# Patient Record
Sex: Female | Born: 1985 | Race: White | Hispanic: No | Smoking: Current every day smoker
Health system: Southern US, Community
[De-identification: ages and names within clinical notes are randomized; demographics above are authoritative.]

## PROBLEM LIST (undated history)

## (undated) DIAGNOSIS — I208 Other forms of angina pectoris: Secondary | ICD-10-CM

## (undated) DIAGNOSIS — I2089 Other forms of angina pectoris: Secondary | ICD-10-CM

## (undated) HISTORY — PX: OTHER SURGICAL HISTORY: SHX169

---
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
## 2007-06-24 ENCOUNTER — Other Ambulatory Visit: Admission: RE | Admit: 2007-06-24 | Discharge: 2007-06-24 | Payer: Self-pay | Admitting: Obstetrics & Gynecology

## 2008-06-14 ENCOUNTER — Emergency Department (HOSPITAL_COMMUNITY): Admission: EM | Admit: 2008-06-14 | Discharge: 2008-06-14 | Payer: Self-pay | Admitting: Emergency Medicine

## 2008-11-25 ENCOUNTER — Emergency Department (HOSPITAL_COMMUNITY): Admission: EM | Admit: 2008-11-25 | Discharge: 2008-11-25 | Payer: Self-pay | Admitting: Emergency Medicine

## 2010-09-18 LAB — URINE MICROSCOPIC-ADD ON

## 2010-09-18 LAB — CBC
HCT: 45.3 % (ref 36.0–46.0)
Hemoglobin: 16 g/dL — ABNORMAL HIGH (ref 12.0–15.0)
MCHC: 35.3 g/dL (ref 30.0–36.0)
MCV: 96.1 fL (ref 78.0–100.0)
RBC: 4.71 MIL/uL (ref 3.87–5.11)
RDW: 13.7 % (ref 11.5–15.5)

## 2010-09-18 LAB — BASIC METABOLIC PANEL
CO2: 27 mEq/L (ref 19–32)
Chloride: 107 mEq/L (ref 96–112)
Glucose, Bld: 97 mg/dL (ref 70–99)
Potassium: 3.9 mEq/L (ref 3.5–5.1)
Sodium: 139 mEq/L (ref 135–145)

## 2010-09-18 LAB — DIFFERENTIAL
Basophils Absolute: 0 10*3/uL (ref 0.0–0.1)
Basophils Relative: 0 % (ref 0–1)
Eosinophils Relative: 3 % (ref 0–5)
Monocytes Absolute: 1.1 10*3/uL — ABNORMAL HIGH (ref 0.1–1.0)
Monocytes Relative: 5 % (ref 3–12)

## 2010-09-18 LAB — URINALYSIS, ROUTINE W REFLEX MICROSCOPIC
Bilirubin Urine: NEGATIVE
Glucose, UA: NEGATIVE mg/dL
Protein, ur: NEGATIVE mg/dL
Urobilinogen, UA: 0.2 mg/dL (ref 0.0–1.0)

## 2010-09-25 LAB — PREGNANCY, URINE: Preg Test, Ur: NEGATIVE
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
## 2010-10-27 NOTE — Discharge Summary (Signed)
[facility]  Patient:    Megan Barron, Megan Barron                           MRN: [id number] Adm. Date:  [id number] Disc. Date: [id number] Attending:  Veneta Penton                           Discharge Summary  REASON FOR ADMISSION:  This 25 year old white female was admitted complaining of depression with suicidal ideation with a plan to cut her throat.  For further history of present illness, please see the patients psychiatric admission assessment.  PHYSICAL EXAMINATION AT THE TIME OF ADMISSION:  Morbid obesity, psoriasis, scoliosis, and gastroesophageal reflux disease.  The patient also has a history of lactose intolerance and a sprained right fifth finger.  She had an otherwise unremarkable physical examination.  LABORATORY EXAMINATION:  The patient underwent a laboratory workup to rule out any medical problems contributing to her symptomatology.  Urine probe for gonorrhea and chlamydia were negative.  Hepatic panel showed SGOT 57, SGPT 63, and was otherwise unremarkable.  Valproic acid level was 103.3.  CBC was otherwise unremarkable.  Routine chemistry panel on admission showed potassium 2.8, BUN 5, and was otherwise unremarkable.  TSH was 6.453, free T4 was within normal limits.  Urine pregnancy test was within normal limits.  Urine drug screen was negative.  UA was unremarkable.  The patient received no x-rays, no special procedures, no additional consultations.  She sustained no complications during the course of this hospitalization.  HOSPITAL COURSE:  The patient rapidly adapted to unit routine, socializing well with both patients and staff.  She was oppositional and defiant, showed poor impulse control.  Affect and mood were depressed, irritable, and angry. She showed an excessive reliance on borderline and antisocial defense mechanisms.  She was titrated upward to a therapeutic dose of Depakote and has been continued on Topamax and Wellbutrin, which  were admission medications. She has tolerated these medications well without side effects.  She continues on Pepcid 20 mg per day for gastroesophageal reflux disease.  At the time of discharge she denies any homicidal or suicidal ideation.  Her affect and mood and improved.  She continues to show oppositional and defiant behavior.  She is manipulative, frequently lies, and continues to show and excessive reliance on antisocial and borderline defense mechanisms.  As she is participating in all aspects of the therapeutic treatment program and no longer appears to be a danger to herself or other, it is felt that she has reached her maximum benefits of hospitalization and is ready for discharge to a less restrictive alternative setting.  CONDITION ON DISCHARGE:  Improved.  DIAGNOSES: Axis I:    1. Major depression, recurrent type, severe without psychosis.            2. Rule out bipolar disorder, depressed type, severe without               psychosis.            3. Conduct disorder. Axis II:   1. Antisocial and borderline traits.            2. Rule out personality disorder, not otherwise specified. Axis III:  1. Gastroesophageal reflux disease.            2. Obesity.            3. Scoliosis.  4. Lactose intolerance.            5. Sprained right fifth finger.            6. Psoriasis. Axis IV:   Current psychosocial stressors are severe. Axis V:    20 on admission, 30 on discharge.  FURTHER EVALUATION AND TREATMENT RECOMMENDATIONS: 1. The patient is discharged to the custody of her DSS worker and will be    taken back to her group home. 2. She will follow up with Dr. Wynonia Lawman for all further aspects of her    psychiatric care and consequently, I will sign off on the case at this    time. 3. She is discharged on an unrestricted level of activity and a regular diet.  DISCHARGE MEDICATIONS: 1. Depakote ER 1500 mg p.o. q.h.s. 2. Wellbutrin SR 150 mg p.o. q.a.m. 3. Topamax 100  mg p.o. b.i.d. 4. Pepcid 20 mg p.o. q.d. DD:  12/03/00 TD:  12/03/00 Job: [id number]

## 2012-11-12 ENCOUNTER — Emergency Department (HOSPITAL_COMMUNITY)
Admission: EM | Admit: 2012-11-12 | Discharge: 2012-11-13 | Disposition: A | Discharge status: Home / Self Care | Payer: Medicaid Other | Attending: Emergency Medicine | Admitting: Emergency Medicine

## 2012-11-12 ENCOUNTER — Emergency Department (HOSPITAL_COMMUNITY): Payer: Medicaid Other

## 2012-11-12 ENCOUNTER — Encounter (HOSPITAL_COMMUNITY): Payer: Self-pay | Admitting: Emergency Medicine

## 2012-11-12 DIAGNOSIS — F17200 Nicotine dependence, unspecified, uncomplicated: Secondary | ICD-10-CM | POA: Insufficient documentation

## 2012-11-12 DIAGNOSIS — N739 Female pelvic inflammatory disease, unspecified: Secondary | ICD-10-CM | POA: Insufficient documentation

## 2012-11-12 DIAGNOSIS — R109 Unspecified abdominal pain: Secondary | ICD-10-CM | POA: Insufficient documentation

## 2012-11-12 DIAGNOSIS — N898 Other specified noninflammatory disorders of vagina: Secondary | ICD-10-CM | POA: Insufficient documentation

## 2012-11-12 DIAGNOSIS — Z87410 Personal history of cervical dysplasia: Secondary | ICD-10-CM | POA: Insufficient documentation

## 2012-11-12 DIAGNOSIS — Z3202 Encounter for pregnancy test, result negative: Secondary | ICD-10-CM | POA: Insufficient documentation

## 2012-11-12 DIAGNOSIS — R300 Dysuria: Secondary | ICD-10-CM | POA: Insufficient documentation

## 2012-11-12 LAB — CBC WITH DIFFERENTIAL/PLATELET
Basophils Absolute: 0 10*3/uL (ref 0.0–0.1)
Basophils Relative: 0 % (ref 0–1)
Eosinophils Absolute: 0.3 10*3/uL (ref 0.0–0.7)
Lymphocytes Relative: 17 % (ref 12–46)
MCH: 32.7 pg (ref 26.0–34.0)
MCHC: 34.6 g/dL (ref 30.0–36.0)
Monocytes Absolute: 1.3 10*3/uL — ABNORMAL HIGH (ref 0.1–1.0)
Neutrophils Relative %: 77 % (ref 43–77)
Platelets: 341 10*3/uL (ref 150–400)
RDW: 13 % (ref 11.5–15.5)

## 2012-11-12 LAB — COMPREHENSIVE METABOLIC PANEL
AST: 20 U/L (ref 0–37)
CO2: 26 mEq/L (ref 19–32)
Calcium: 10 mg/dL (ref 8.4–10.5)
Creatinine, Ser: 0.67 mg/dL (ref 0.50–1.10)
GFR calc non Af Amer: >90 mL/min (ref >90)
Sodium: 135 mEq/L (ref 135–145)
Total Protein: 8 g/dL (ref 6.0–8.3)

## 2012-11-12 LAB — WET PREP, GENITAL
Trich, Wet Prep: NONE SEEN
Yeast Wet Prep HPF POC: NONE SEEN

## 2012-11-12 LAB — URINALYSIS, ROUTINE W REFLEX MICROSCOPIC
Bilirubin Urine: NEGATIVE
Ketones, ur: NEGATIVE mg/dL
Nitrite: NEGATIVE
Specific Gravity, Urine: 1.02 (ref 1.005–1.030)
Urobilinogen, UA: 0.2 mg/dL (ref 0.0–1.0)
pH: 6 (ref 5.0–8.0)

## 2012-11-12 LAB — URINE MICROSCOPIC-ADD ON

## 2012-11-12 MED ORDER — CEFTRIAXONE SODIUM 250 MG IJ SOLR
250.0000 mg | Freq: Once | INTRAMUSCULAR | Status: AC
  Administered 2012-11-12: 250 mg via INTRAMUSCULAR
  Filled 2012-11-12: qty 250

## 2012-11-12 MED ORDER — SODIUM CHLORIDE 0.9 % IV BOLUS (SEPSIS)
1000.0000 mL | Freq: Once | INTRAVENOUS | Status: AC
  Administered 2012-11-12: 1000 mL via INTRAVENOUS

## 2012-11-12 MED ORDER — DOXYCYCLINE HYCLATE 100 MG PO TABS
100.0000 mg | ORAL_TABLET | Freq: Once | ORAL | Status: AC
  Administered 2012-11-12: 100 mg via ORAL
  Filled 2012-11-12: qty 1

## 2012-11-12 MED ORDER — OXYCODONE-ACETAMINOPHEN 5-325 MG PO TABS: 2.0000 | ORAL_TABLET | ORAL | Status: DC | PRN

## 2012-11-12 MED ORDER — PROMETHAZINE HCL 25 MG PO TABS: 25.0000 mg | ORAL_TABLET | Freq: Four times a day (QID) | ORAL | Status: DC | PRN

## 2012-11-12 MED ORDER — LIDOCAINE HCL (PF) 1 % IJ SOLN
INTRAMUSCULAR | Status: AC
  Administered 2012-11-12: 0.9 mL via INTRAMUSCULAR
  Filled 2012-11-12: qty 5

## 2012-11-12 MED ORDER — METRONIDAZOLE 500 MG PO TABS: 500.0000 mg | ORAL_TABLET | Freq: Two times a day (BID) | ORAL | Status: DC

## 2012-11-12 MED ORDER — IOHEXOL 300 MG/ML  SOLN
50.0000 mL | Freq: Once | INTRAMUSCULAR | Status: AC | PRN
  Administered 2012-11-12: 50 mL via ORAL

## 2012-11-12 MED ORDER — ONDANSETRON HCL 4 MG/2ML IJ SOLN
4.0000 mg | Freq: Once | INTRAMUSCULAR | Status: AC
  Administered 2012-11-12: 4 mg via INTRAVENOUS
  Filled 2012-11-12: qty 2

## 2012-11-12 MED ORDER — HYDROMORPHONE HCL PF 1 MG/ML IJ SOLN
1.0000 mg | Freq: Once | INTRAMUSCULAR | Status: AC
  Administered 2012-11-12: 1 mg via INTRAVENOUS
  Filled 2012-11-12: qty 1

## 2012-11-12 MED ORDER — IOHEXOL 300 MG/ML  SOLN
100.0000 mL | Freq: Once | INTRAMUSCULAR | Status: AC | PRN
  Administered 2012-11-12: 100 mL via INTRAVENOUS

## 2012-11-12 MED ORDER — METRONIDAZOLE 500 MG PO TABS
500.0000 mg | ORAL_TABLET | Freq: Once | ORAL | Status: AC
  Administered 2012-11-12: 500 mg via ORAL
  Filled 2012-11-12: qty 1

## 2012-11-12 MED ORDER — DOXYCYCLINE HYCLATE 100 MG PO CAPS: 100.0000 mg | ORAL_CAPSULE | Freq: Two times a day (BID) | ORAL | Status: DC

## 2012-11-12 NOTE — ED Provider Notes (Addendum)
History     This chart was scribed for Donnetta Hutching, MD, MD by Smitty Pluck, ED Scribe. The patient was seen in room APA15/APA15 and the patient's care was started at 7:53 PM.   CSN: [id number]  Arrival date & time 11/12/12  1859    Chief Complaint  Patient presents with  . Pelvic Pain  . Dysuria  . pelvic pressure      The history is provided by the patient and medical records. No language interpreter was used.   HPI Comments: Megan Barron is a 27 y.o. 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 Pain is rated at 8/10 currently and at worst was 10/10. She mentions having mild vaginal discharge. She states her LMP was in March 2014. She reports hx of cervical dysplasia.  Pt denies vaginal bleeding, fever, chills, nausea, vomiting, diarrhea, weakness, cough, SOB and any other pain.   History reviewed. No pertinent past medical history.  Past Surgical History  Procedure Laterality Date  . Freon procedure for cervical dysplasia      Family History  Problem Relation Age of Onset  . Diabetes Father   . Cancer Other   . Diabetes Other     History  Substance Use Topics  . Smoking status: Current Every Day Smoker -- 1.00 packs/day    Types: Cigarettes  . Smokeless tobacco: Not on file  . Alcohol Use: Yes    OB History   Grav Para Term Preterm Abortions TAB SAB Ect Mult Living                  Review of Systems 10 Systems reviewed and all are negative for acute change except as noted in the HPI.   Allergies  Review of patient's allergies indicates no known allergies.  Home Medications  No current outpatient prescriptions on file.  BP 144/82  Pulse 119  Temp(Src) 99 F (37.2 C) (Oral)  Resp 20  Ht 5\' 2"  (1.575 m)  Wt 210 lb (95.255 kg)  BMI 38.4 kg/m2  SpO2 99%  LMP 08/12/2012  Physical Exam  Nursing note and vitals  reviewed. Constitutional: She is oriented to person, place, and time. She appears well-developed and well-nourished.  HENT:  Head: Normocephalic and atraumatic.  Eyes: Conjunctivae and EOM are normal. Pupils are equal, round, and reactive to light.  Neck: Normal range of motion. Neck supple.  Cardiovascular: Normal rate, regular rhythm and normal heart sounds.   Pulmonary/Chest: Effort normal and breath sounds normal.  Abdominal: Soft. Bowel sounds are normal.  Genitourinary:  External genitalia nl Yellow discharge from cervical os. cervical motion tenderness No adnexal tenderness   Musculoskeletal: Normal range of motion.  Neurological: She is alert and oriented to person, place, and time.  Skin: Skin is warm and dry.  Psychiatric: She has a normal mood and affect.    ED Course  Procedures (including critical care time) DIAGNOSTIC STUDIES: Oxygen Saturation is 99% on room air, normal by my interpretation.    COORDINATION OF CARE: 7:56 PM Discussed ED treatment with pt and pt agrees labs, pelvic and pain medications     Results for orders placed during the hospital encounter of 11/12/12  WET PREP, GENITAL      Result Value Range   Yeast Wet Prep HPF POC NONE SEEN  NONE SEEN   Trich, Wet Prep NONE SEEN  NONE SEEN   Clue Cells Wet Prep HPF POC FEW (*) NONE SEEN   WBC, Wet Prep HPF POC MANY (*) NONE SEEN  URINALYSIS, ROUTINE W REFLEX MICROSCOPIC      Result Value Range   Color, Urine YELLOW  YELLOW   APPearance CLEAR  CLEAR   Specific Gravity, Urine 1.020  1.005 - 1.030   pH 6.0  5.0 - 8.0   Glucose, UA NEGATIVE  NEGATIVE mg/dL   Hgb urine dipstick SMALL (*) NEGATIVE   Bilirubin Urine NEGATIVE  NEGATIVE   Ketones, ur NEGATIVE  NEGATIVE mg/dL   Protein, ur NEGATIVE  NEGATIVE mg/dL   Urobilinogen, UA 0.2  0.0 - 1.0 mg/dL   Nitrite NEGATIVE  NEGATIVE   Leukocytes, UA MODERATE (*) NEGATIVE  PREGNANCY, URINE      Result Value Range   Preg Test, Ur NEGATIVE  NEGATIVE  URINE  MICROSCOPIC-ADD ON      Result Value Range   Squamous Epithelial / LPF MANY (*) RARE   WBC, UA 3-6  <3 WBC/hpf   RBC / HPF 0-2  <3 RBC/hpf   Bacteria, UA FEW (*) RARE  CBC WITH DIFFERENTIAL      Result Value Range   WBC 25.2 (*) 4.0 - 10.5 K/uL   RBC 4.80  3.87 - 5.11 MIL/uL   Hemoglobin 15.7 (*) 12.0 - 15.0 g/dL   HCT 16.1  09.6 - 04.5 %   MCV 94.6  78.0 - 100.0 fL   MCH 32.7  26.0 - 34.0 pg   MCHC 34.6  30.0 - 36.0 g/dL   RDW 40.9  81.1 - 91.4 %   Platelets 341  150 - 400 K/uL   Neutrophils Relative % 77  43 - 77 %   Lymphocytes Relative 17  12 - 46 %   Monocytes Relative 5  3 - 12 %   Eosinophils Relative 1  0 - 5 %   Basophils Relative 0  0 - 1 %   Neutro Abs 19.3 (*) 1.7 - 7.7 K/uL   Lymphs Abs 4.3 (*) 0.7 - 4.0 K/uL   Monocytes Absolute 1.3 (*) 0.1 - 1.0 K/uL   Eosinophils Absolute 0.3  0.0 - 0.7 K/uL   Basophils Absolute 0.0  0.0 - 0.1 K/uL   RBC Morphology STOMATOCYTES     WBC Morphology ATYPICAL LYMPHOCYTES    COMPREHENSIVE METABOLIC PANEL      Result Value Range   Sodium 135  135 - 145 mEq/L   Potassium 3.5  3.5 - 5.1 mEq/L   Chloride 98  96 - 112 mEq/L   CO2 26  19 - 32 mEq/L   Glucose, Bld 90  70 - 99 mg/dL   BUN 8  6 - 23 mg/dL   Creatinine, Ser 7.82  0.50 - 1.10 mg/dL   Calcium 95.6  8.4 - 21.3 mg/dL   Total Protein 8.0  6.0 - 8.3 g/dL   Albumin 4.5  3.5 - 5.2 g/dL   AST 20  0 - 37 U/L   ALT 20  0 - 35 U/L   Alkaline Phosphatase 81  39 - 117 U/L   Total Bilirubin 0.5  0.3 - 1.2 mg/dL   GFR calc non Af Amer >90  >90 mL/min   GFR calc Af Amer >90  >90 mL/min      Ct Abdomen Pelvis W Contrast  11/12/2012   *RADIOLOGY REPORT*  Clinical Data: Lower abdominal pain and pelvic pain for 3 months.  CT  ABDOMEN AND PELVIS WITH CONTRAST  Technique:  Multidetector CT imaging of the abdomen and pelvis was performed following the standard protocol during bolus administration of intravenous contrast.  Contrast: 100 mL of Omnipaque 300 IV contrast  Comparison: CT of the  abdomen and pelvis from 11/25/2008  Findings: The visualized lung bases are clear.  The liver and spleen are unremarkable in appearance.  The gallbladder is mildly distended but otherwise unremarkable in appearance.  The pancreas and adrenal glands are unremarkable.  The kidneys are unremarkable in appearance.  There is no evidence of hydronephrosis.  No renal or ureteral stones are seen.  No perinephric stranding is appreciated.  The small bowel is unremarkable in appearance.  The stomach is within normal limits.  No acute vascular abnormalities are seen.  The appendix is not well characterized; there is no definite evidence for appendicitis.  The colon is unremarkable in appearance.  The bladder is mildly distended and grossly unremarkable.  There is a recurrent 4.3 x 1.8 cm collection of fluid noted within the lower uterine segment; on correlation with the patient's history, this could be related to recurrent pelvic inflammatory disease.  There is associated nonspecific soft tissue stranding and the upper pelvis, and trace free fluid in the pelvis.  No inguinal lymphadenopathy is seen.  The right ovary is larger than the left; this appears grossly stable from 2010 and likely reflects the patient's baseline.  No suspicious adnexal masses are seen.  No acute osseous abnormalities are identified.  IMPRESSION: Recurrent 4.3 x 1.8 cm collection of fluid noted within the lower uterine segment, though it is slightly smaller than in 2010.  Multiple possible etiologies were suggested on the prior study, ranging from blood within the endometrial canal to intrauterine pregnancy, but given the patient's known history of pelvic inflammatory disease, would correlate for evidence of recurrent pelvic inflammatory disease.  Associated mild soft tissue inflammation noted within the pelvis.   Original Report Authenticated By: Tonia Ghent, M.D.     No diagnosis found.    MDM  History and physical consistent with pelvic  inflammatory disease. No acute abdomen. Rocephin 250 mg IV given along with first dose of doxycycline 100 mg and Flagyl 500 mg.    Will take Flagyl and doxycycline for 2 weeks. Also medication for pain and nausea.  Referral to local gynecologist.  Patient is not septic and hemodynamically stable.     I personally performed the services described in this documentation, which was scribed in my presence. The recorded information has been reviewed and is accurate.    Donnetta Hutching, MD 11/12/12 1610  Donnetta Hutching, MD 11/15/12 0930

## 2012-11-12 NOTE — ED Notes (Signed)
Pt c/o pelvic pain with pain while urinating and having a BM. Pt has had PID several years ago.

## 2012-11-13 LAB — URINE CULTURE
Colony Count: NO GROWTH
Culture: NO GROWTH

## 2012-11-14 LAB — GC/CHLAMYDIA PROBE AMP: GC Probe RNA: NEGATIVE

## 2012-12-15 ENCOUNTER — Ambulatory Visit: Payer: Self-pay | Admitting: Obstetrics and Gynecology

## 2013-09-02 ENCOUNTER — Emergency Department (HOSPITAL_COMMUNITY)
Admission: EM | Admit: 2013-09-02 | Discharge: 2013-09-02 | Disposition: A | Discharge status: Home / Self Care | Payer: Medicaid Other | Attending: Emergency Medicine | Admitting: Emergency Medicine

## 2013-09-02 ENCOUNTER — Encounter (HOSPITAL_COMMUNITY): Payer: Self-pay | Admitting: Emergency Medicine

## 2013-09-02 DIAGNOSIS — F17200 Nicotine dependence, unspecified, uncomplicated: Secondary | ICD-10-CM | POA: Insufficient documentation

## 2013-09-02 DIAGNOSIS — Z3202 Encounter for pregnancy test, result negative: Secondary | ICD-10-CM | POA: Insufficient documentation

## 2013-09-02 DIAGNOSIS — K5289 Other specified noninfective gastroenteritis and colitis: Secondary | ICD-10-CM | POA: Insufficient documentation

## 2013-09-02 DIAGNOSIS — K529 Noninfective gastroenteritis and colitis, unspecified: Secondary | ICD-10-CM

## 2013-09-02 DIAGNOSIS — I209 Angina pectoris, unspecified: Secondary | ICD-10-CM | POA: Insufficient documentation

## 2013-09-02 HISTORY — DX: Other forms of angina pectoris: I20.8

## 2013-09-02 HISTORY — DX: Other forms of angina pectoris: I20.89

## 2013-09-02 LAB — URINALYSIS, ROUTINE W REFLEX MICROSCOPIC
Bilirubin Urine: NEGATIVE
GLUCOSE, UA: NEGATIVE mg/dL
LEUKOCYTES UA: NEGATIVE
Nitrite: NEGATIVE
PH: 6 (ref 5.0–8.0)
Specific Gravity, Urine: >1.03 — ABNORMAL HIGH (ref 1.005–1.030)
Urobilinogen, UA: 0.2 mg/dL (ref 0.0–1.0)

## 2013-09-02 LAB — CBC WITH DIFFERENTIAL/PLATELET
BASOS ABS: 0 10*3/uL (ref 0.0–0.1)
Basophils Relative: 0 % (ref 0–1)
Eosinophils Absolute: 0.4 10*3/uL (ref 0.0–0.7)
Eosinophils Relative: 4 % (ref 0–5)
HEMATOCRIT: 47.6 % — AB (ref 36.0–46.0)
HEMOGLOBIN: 16.1 g/dL — AB (ref 12.0–15.0)
LYMPHS PCT: 30 % (ref 12–46)
Lymphs Abs: 2.9 10*3/uL (ref 0.7–4.0)
MCH: 32.7 pg (ref 26.0–34.0)
MCHC: 33.8 g/dL (ref 30.0–36.0)
MCV: 96.6 fL (ref 78.0–100.0)
MONO ABS: 0.5 10*3/uL (ref 0.1–1.0)
MONOS PCT: 5 % (ref 3–12)
NEUTROS ABS: 6 10*3/uL (ref 1.7–7.7)
Neutrophils Relative %: 62 % (ref 43–77)
Platelets: 351 10*3/uL (ref 150–400)
RBC: 4.93 MIL/uL (ref 3.87–5.11)
RDW: 12.8 % (ref 11.5–15.5)
WBC: 9.7 10*3/uL (ref 4.0–10.5)

## 2013-09-02 LAB — HEPATIC FUNCTION PANEL
ALBUMIN: 4.2 g/dL (ref 3.5–5.2)
ALT: 106 U/L — ABNORMAL HIGH (ref 0–35)
AST: 65 U/L — AB (ref 0–37)
Alkaline Phosphatase: 77 U/L (ref 39–117)
Bilirubin, Direct: <0.2 mg/dL (ref 0.0–0.3)
TOTAL PROTEIN: 8.2 g/dL (ref 6.0–8.3)
Total Bilirubin: 0.5 mg/dL (ref 0.3–1.2)

## 2013-09-02 LAB — BASIC METABOLIC PANEL
BUN: 9 mg/dL (ref 6–23)
CHLORIDE: 102 meq/L (ref 96–112)
CO2: 26 meq/L (ref 19–32)
CREATININE: 0.64 mg/dL (ref 0.50–1.10)
Calcium: 8.7 mg/dL (ref 8.4–10.5)
GFR calc Af Amer: >90 mL/min (ref >90)
GFR calc non Af Amer: >90 mL/min (ref >90)
Glucose, Bld: 139 mg/dL — ABNORMAL HIGH (ref 70–99)
Potassium: 3.9 mEq/L (ref 3.7–5.3)
Sodium: 141 mEq/L (ref 137–147)

## 2013-09-02 LAB — URINE MICROSCOPIC-ADD ON

## 2013-09-02 LAB — PREGNANCY, URINE: PREG TEST UR: NEGATIVE

## 2013-09-02 MED ORDER — ONDANSETRON 4 MG PO TBDP: ORAL_TABLET | ORAL | Status: DC

## 2013-09-02 MED ORDER — DICYCLOMINE HCL 20 MG PO TABS: ORAL_TABLET | ORAL | Status: DC

## 2013-09-02 MED ORDER — KETOROLAC TROMETHAMINE 30 MG/ML IJ SOLN
30.0000 mg | Freq: Once | INTRAMUSCULAR | Status: AC
  Administered 2013-09-02: 30 mg via INTRAVENOUS
  Filled 2013-09-02: qty 1

## 2013-09-02 MED ORDER — SODIUM CHLORIDE 0.9 % IV BOLUS (SEPSIS)
1000.0000 mL | Freq: Once | INTRAVENOUS | Status: AC
  Administered 2013-09-02: 1000 mL via INTRAVENOUS

## 2013-09-02 MED ORDER — ONDANSETRON HCL 4 MG/2ML IJ SOLN
4.0000 mg | Freq: Once | INTRAMUSCULAR | Status: AC
  Administered 2013-09-02: 4 mg via INTRAVENOUS
  Filled 2013-09-02: qty 2

## 2013-09-02 NOTE — ED Provider Notes (Signed)
CSN: [id number]     Arrival date & time 09/02/13  0756 History   First MD Initiated Contact with Patient 09/02/13 0807    This chart was scribed for Benny Lennert, MD by Valera Castle, ED Scribe. This patient was seen in room APA09/APA09 and the patient's care was started at 8:26 AM.  Chief Complaint  Patient presents with  . Emesis   (Consider location/radiation/quality/duration/timing/severity/associated sxs/prior Treatment)  Patient is a 28 y.o. female presenting with vomiting and diarrhea. The history is provided by the patient. No language interpreter was used.  Emesis Duration:  1 week Progression:  Worsening Chronicity:  New Recent urination:  Normal Associated symptoms: diarrhea   Associated symptoms: no abdominal pain and no headaches   Diarrhea Diarrhea characteristics: dark, watery. Duration:  1 week Timing: every 15-20 minutes. Progression:  Worsening Associated symptoms: vomiting   Associated symptoms: no abdominal pain and no headaches    HPI Comments: Megan Barron is a 28 y.o. female who presents to the Emergency Department complaining of intermittent vomiting, with associated nausea and diarrhea, onset 1 week ago. She reports having diarrhea every 15-20 minutes. She reports dark stools with bowel movements. She reports her nephews are having similar symptoms of diarrhea and vomiting. She states she takes 1 Iron pill a month, states it hurts her stomach. She denies any other associated symptoms. She denies h/o abdominal surgery.  PCP - No primary provider on file.  Past Medical History  Diagnosis Date  . Angina at rest    Past Surgical History  Procedure Laterality Date  . Freon procedure for cervical dysplasia     Family History  Problem Relation Age of Onset  . Diabetes Father   . Cancer Other   . Diabetes Other    History  Substance Use Topics  . Smoking status: Current Every Day Smoker -- 1.00 packs/day    Types: Cigarettes  . Smokeless tobacco:  Not on file  . Alcohol Use: Yes   OB History   Grav Para Term Preterm Abortions TAB SAB Ect Mult Living                 Review of Systems  Constitutional: Negative for appetite change and fatigue.  HENT: Negative for congestion, ear discharge and sinus pressure.   Eyes: Negative for discharge.  Respiratory: Negative for cough.   Cardiovascular: Negative for chest pain.  Gastrointestinal: Positive for nausea, vomiting and diarrhea. Negative for abdominal pain.  Genitourinary: Negative for frequency and hematuria.  Musculoskeletal: Negative for back pain.  Skin: Negative for rash.  Neurological: Negative for seizures and headaches.  Psychiatric/Behavioral: Negative for hallucinations.   Allergies  Review of patient's allergies indicates no known allergies.  Home Medications   Current Outpatient Rx  Name  Route  Sig  Dispense  Refill  . calcium carbonate (TUMS - DOSED IN MG ELEMENTAL CALCIUM) 500 MG chewable tablet   Oral   Chew 2 tablets by mouth 3 (three) times daily as needed for indigestion or heartburn.         . ferrous sulfate 325 (65 FE) MG tablet   Oral   Take 325 mg by mouth every 30 (thirty) days.          . Multiple Vitamin (MULTIVITAMIN WITH MINERALS) TABS tablet   Oral   Take 1 tablet by mouth daily.         Marland Kitchen Pheniramine-PE-APAP (THERAFLU COLD & SORE THROAT PO)   Oral   Take 1 Package  by mouth 2 (two) times daily as needed (COLD/COUGH).         . Pseudoephedrine-APAP-DM (TYLENOL COLD/FLU SEVERE DAY PO)   Oral   Take 2 capsules by mouth every 6 (six) hours as needed (COLD/COUGH).          BP 121/80  Pulse 100  Temp(Src) 97.8 F (36.6 C) (Oral)  Resp 20  Ht 5\' 3"  (1.6 m)  Wt 240 lb (108.863 kg)  BMI 42.52 kg/m2  SpO2 96%  Physical Exam  Constitutional: She is oriented to person, place, and time. She appears well-developed.  HENT:  Head: Normocephalic.  Eyes: Conjunctivae and EOM are normal. No scleral icterus.  Neck: Neck supple. No  thyromegaly present.  Cardiovascular: Normal rate and regular rhythm.  Exam reveals no gallop and no friction rub.   No murmur heard. Pulmonary/Chest: No stridor. She has no wheezes. She has no rales. She exhibits no tenderness.  Abdominal: She exhibits no distension. There is no tenderness. There is no rebound.  Musculoskeletal: Normal range of motion. She exhibits no edema.  Lymphadenopathy:    She has no cervical adenopathy.  Neurological: She is oriented to person, place, and time. She exhibits normal muscle tone. Coordination normal.  Skin: No rash noted. No erythema.  Psychiatric: She has a normal mood and affect. Her behavior is normal.    ED Course  Procedures (including critical care time)  DIAGNOSTIC STUDIES: Oxygen Saturation is 96% on room air, normal by my interpretation.    COORDINATION OF CARE: 8:30 AM-Discussed treatment plan which includes blood work, UA, IV fluids, pain and nausea medication with pt at bedside and pt agreed to plan.   Results for orders placed during the hospital encounter of 09/02/13  CBC WITH DIFFERENTIAL      Result Value Ref Range   WBC 9.7  4.0 - 10.5 K/uL   RBC 4.93  3.87 - 5.11 MIL/uL   Hemoglobin 16.1 (*) 12.0 - 15.0 g/dL   HCT 16.1 (*) 09.6 - 04.5 %   MCV 96.6  78.0 - 100.0 fL   MCH 32.7  26.0 - 34.0 pg   MCHC 33.8  30.0 - 36.0 g/dL   RDW 40.9  81.1 - 91.4 %   Platelets 351  150 - 400 K/uL   Neutrophils Relative % 62  43 - 77 %   Neutro Abs 6.0  1.7 - 7.7 K/uL   Lymphocytes Relative 30  12 - 46 %   Lymphs Abs 2.9  0.7 - 4.0 K/uL   Monocytes Relative 5  3 - 12 %   Monocytes Absolute 0.5  0.1 - 1.0 K/uL   Eosinophils Relative 4  0 - 5 %   Eosinophils Absolute 0.4  0.0 - 0.7 K/uL   Basophils Relative 0  0 - 1 %   Basophils Absolute 0.0  0.0 - 0.1 K/uL  BASIC METABOLIC PANEL      Result Value Ref Range   Sodium 141  137 - 147 mEq/L   Potassium 3.9  3.7 - 5.3 mEq/L   Chloride 102  96 - 112 mEq/L   CO2 26  19 - 32 mEq/L    Glucose, Bld 139 (*) 70 - 99 mg/dL   BUN 9  6 - 23 mg/dL   Creatinine, Ser 7.82  0.50 - 1.10 mg/dL   Calcium 8.7  8.4 - 95.6 mg/dL   GFR calc non Af Amer >90  >90 mL/min   GFR calc Af Amer >90  >90  mL/min  URINALYSIS, ROUTINE W REFLEX MICROSCOPIC      Result Value Ref Range   Color, Urine YELLOW  YELLOW   APPearance CLOUDY (*) CLEAR   Specific Gravity, Urine >1.030 (*) 1.005 - 1.030   pH 6.0  5.0 - 8.0   Glucose, UA NEGATIVE  NEGATIVE mg/dL   Hgb urine dipstick TRACE (*) NEGATIVE   Bilirubin Urine NEGATIVE  NEGATIVE   Ketones, ur TRACE (*) NEGATIVE mg/dL   Protein, ur TRACE (*) NEGATIVE mg/dL   Urobilinogen, UA 0.2  0.0 - 1.0 mg/dL   Nitrite NEGATIVE  NEGATIVE   Leukocytes, UA NEGATIVE  NEGATIVE  PREGNANCY, URINE      Result Value Ref Range   Preg Test, Ur NEGATIVE  NEGATIVE  HEPATIC FUNCTION PANEL      Result Value Ref Range   Total Protein 8.2  6.0 - 8.3 g/dL   Albumin 4.2  3.5 - 5.2 g/dL   AST 65 (*) 0 - 37 U/L   ALT 106 (*) 0 - 35 U/L   Alkaline Phosphatase 77  39 - 117 U/L   Total Bilirubin 0.5  0.3 - 1.2 mg/dL   Bilirubin, Direct <6.2<0.2  0.0 - 0.3 mg/dL   Indirect Bilirubin NOT CALCULATED  0.3 - 0.9 mg/dL  URINE MICROSCOPIC-ADD ON      Result Value Ref Range   Squamous Epithelial / LPF FEW (*) RARE   RBC / HPF 0-2  <3 RBC/hpf   Bacteria, UA MANY (*) RARE   No results found.   EKG Interpretation None     Medications  sodium chloride 0.9 % bolus 1,000 mL (1,000 mLs Intravenous New Bag/Given 09/02/13 0920)  ketorolac (TORADOL) 30 MG/ML injection 30 mg (30 mg Intravenous Given 09/02/13 0946)  ondansetron (ZOFRAN) injection 4 mg (4 mg Intravenous Given 09/02/13 0945)   MDM   Final diagnoses:  None  The chart was scribed for me under my direct supervision.  I personally performed the history, physical, and medical decision making and all procedures in the evaluation of this patient.Benny Lennert.   Teniola Tseng L Haley Roza, MD 09/02/13 [id number]

## 2013-09-02 NOTE — ED Notes (Signed)
Pt reports feeling better after getting fluids, states she is hungry and thirsty.

## 2013-09-02 NOTE — ED Notes (Signed)
Pt reports has had n/v/d x 1 week.  Reports has iron deficiency anemia and stools have been black for the past few days.  Pt reports can tolerate fluids but no food.

## 2013-09-02 NOTE — Discharge Instructions (Signed)
Take imodium for diarhea.  Drink plenty of fluids.  Follow up next week if not improving

## 2014-01-25 ENCOUNTER — Encounter (HOSPITAL_COMMUNITY): Payer: Self-pay | Admitting: Emergency Medicine

## 2014-01-25 ENCOUNTER — Emergency Department (HOSPITAL_COMMUNITY)
Admission: EM | Admit: 2014-01-25 | Discharge: 2014-01-25 | Disposition: A | Discharge status: Home / Self Care | Payer: Medicaid Other | Attending: Emergency Medicine | Admitting: Emergency Medicine

## 2014-01-25 DIAGNOSIS — T63461A Toxic effect of venom of wasps, accidental (unintentional), initial encounter: Secondary | ICD-10-CM | POA: Insufficient documentation

## 2014-01-25 DIAGNOSIS — F17200 Nicotine dependence, unspecified, uncomplicated: Secondary | ICD-10-CM | POA: Insufficient documentation

## 2014-01-25 DIAGNOSIS — Y9389 Activity, other specified: Secondary | ICD-10-CM | POA: Diagnosis not present

## 2014-01-25 DIAGNOSIS — T6391XA Toxic effect of contact with unspecified venomous animal, accidental (unintentional), initial encounter: Secondary | ICD-10-CM | POA: Insufficient documentation

## 2014-01-25 DIAGNOSIS — W57XXXA Bitten or stung by nonvenomous insect and other nonvenomous arthropods, initial encounter: Secondary | ICD-10-CM

## 2014-01-25 DIAGNOSIS — Y92009 Unspecified place in unspecified non-institutional (private) residence as the place of occurrence of the external cause: Secondary | ICD-10-CM | POA: Diagnosis not present

## 2014-01-25 DIAGNOSIS — Z791 Long term (current) use of non-steroidal anti-inflammatories (NSAID): Secondary | ICD-10-CM | POA: Insufficient documentation

## 2014-01-25 DIAGNOSIS — Z79899 Other long term (current) drug therapy: Secondary | ICD-10-CM | POA: Diagnosis not present

## 2014-01-25 DIAGNOSIS — Z8679 Personal history of other diseases of the circulatory system: Secondary | ICD-10-CM | POA: Diagnosis not present

## 2014-01-25 DIAGNOSIS — S30861A Insect bite (nonvenomous) of abdominal wall, initial encounter: Secondary | ICD-10-CM

## 2014-01-25 MED ORDER — FAMOTIDINE 20 MG PO TABS
20.0000 mg | ORAL_TABLET | Freq: Once | ORAL | Status: AC
  Administered 2014-01-25: 20 mg via ORAL
  Filled 2014-01-25: qty 1

## 2014-01-25 MED ORDER — HYDROXYZINE HCL 25 MG PO TABS
25.0000 mg | ORAL_TABLET | Freq: Once | ORAL | Status: AC
  Administered 2014-01-25: 25 mg via ORAL
  Filled 2014-01-25: qty 1

## 2014-01-25 MED ORDER — PREDNISONE 50 MG PO TABS
60.0000 mg | ORAL_TABLET | Freq: Once | ORAL | Status: AC
  Administered 2014-01-25: 60 mg via ORAL
  Filled 2014-01-25 (×2): qty 1

## 2014-01-25 MED ORDER — PREDNISONE 20 MG PO TABS: 40.0000 mg | ORAL_TABLET | Freq: Every day | ORAL | Status: AC

## 2014-01-25 MED ORDER — HYDROXYZINE HCL 25 MG PO TABS: 25.0000 mg | ORAL_TABLET | Freq: Four times a day (QID) | ORAL | Status: AC

## 2014-01-25 NOTE — ED Provider Notes (Signed)
CSN: [id number]     Arrival date & time 01/25/14  0908 History   First MD Initiated Contact with Patient 01/25/14 [phone]     Chief Complaint  Patient presents with  . Cellulitis     (Consider location/radiation/quality/duration/timing/severity/associated sxs/prior Treatment) The history is provided by the patient.   Megan AVEN is a 28 y.o. female who presents to the Emergency Department complaining of itching and redness to her lower abdomen.  She states that she was working in her garden yesterday evening and felt something sting her.  She took benadryl x 2 yesterday without relief.  She reports increasing redness that is spreading across her lower abdomen.  She denies swelling, pain, difficulty swallowing or breathing or shortness of breath.     Past Medical History  Diagnosis Date  . Angina at rest    Past Surgical History  Procedure Laterality Date  . Freon procedure for cervical dysplasia     Family History  Problem Relation Age of Onset  . Diabetes Father   . Cancer Other   . Diabetes Other    History  Substance Use Topics  . Smoking status: Current Every Day Smoker -- 1.00 packs/day    Types: Cigarettes  . Smokeless tobacco: Not on file  . Alcohol Use: Yes   OB History   Grav Para Term Preterm Abortions TAB SAB Ect Mult Living                 Review of Systems  Constitutional: Negative for fever, chills, activity change and appetite change.  HENT: Negative for facial swelling, sore throat and trouble swallowing.   Respiratory: Negative for chest tightness, shortness of breath and wheezing.   Gastrointestinal: Negative for nausea, vomiting and abdominal pain.  Musculoskeletal: Negative for arthralgias, neck pain and neck stiffness.  Skin: Negative for wound.       Redness and insect bite/sting to lower abdomen  Neurological: Negative for dizziness, weakness, numbness and headaches.  All other systems reviewed and are negative.     Allergies  Review of  patient's allergies indicates no known allergies.  Home Medications   Prior to Admission medications   Medication Sig Start Date End Date Taking? Authorizing Provider  ibuprofen (ADVIL,MOTRIN) 800 MG tablet Take 800 mg by mouth every 8 (eight) hours as needed.   Yes Historical Provider, MD  oxyCODONE (OXY IR/ROXICODONE) 5 MG immediate release tablet Take 5 mg by mouth every 4 (four) hours as needed for severe pain.   Yes Historical Provider, MD  Sertraline HCl (ZOLOFT PO) Take by mouth.   Yes Historical Provider, MD  calcium carbonate (TUMS - DOSED IN MG ELEMENTAL CALCIUM) 500 MG chewable tablet Chew 2 tablets by mouth 3 (three) times daily as needed for indigestion or heartburn.    Historical Provider, MD  dicyclomine (BENTYL) 20 MG tablet Take one every 6 hours for abdominal cramping 09/02/13   Benny Lennert, MD  ferrous sulfate 325 (65 FE) MG tablet Take 325 mg by mouth every 30 (thirty) days.     Historical Provider, MD  Multiple Vitamin (MULTIVITAMIN WITH MINERALS) TABS tablet Take 1 tablet by mouth daily.    Historical Provider, MD  ondansetron (ZOFRAN ODT) 4 MG disintegrating tablet 4mg  ODT q4 hours prn nausea/vomit 09/02/13   Benny Lennert, MD  Pheniramine-PE-APAP [facility] COLD & SORE THROAT PO) Take 1 Package by mouth 2 (two) times daily as needed (COLD/COUGH).    Historical Provider, MD  Pseudoephedrine-APAP-DM (TYLENOL COLD/FLU SEVERE DAY  PO) Take 2 capsules by mouth every 6 (six) hours as needed (COLD/COUGH).    Historical Provider, MD   BP 151/96  Pulse 78  Temp(Src) 98.7 F (37.1 C) (Oral)  Resp 16  Ht 5\' 2"  (1.575 m)  Wt 240 lb (108.863 kg)  BMI 43.89 kg/m2  SpO2 99%  LMP 01/25/2014 Physical Exam  Nursing note and vitals reviewed. Constitutional: She is oriented to person, place, and time. She appears well-developed and well-nourished. No distress.  HENT:  Head: Normocephalic and atraumatic.  Mouth/Throat: Uvula is midline, oropharynx is clear and moist and mucous  membranes are normal. No uvula swelling.  Eyes: Conjunctivae are normal. Pupils are equal, round, and reactive to light.  Cardiovascular: Normal rate, regular rhythm, normal heart sounds and intact distal pulses.   No murmur heard. Pulmonary/Chest: Effort normal and breath sounds normal. No respiratory distress.  Abdominal: Soft. She exhibits no distension and no mass. There is no tenderness. There is no rebound and no guarding.  Musculoskeletal: Normal range of motion. She exhibits no edema.  Neurological: She is alert and oriented to person, place, and time. She exhibits normal muscle tone. Coordination normal.  Skin: Skin is warm and dry.  Localized erythema extending across the lower abdomen.  Small, scabbed lesion on the right that patient reports as site of the sting.  No pustules, vesicles or induration    ED Course  Procedures (including critical care time) Labs Review Labs Reviewed - No data to display  Imaging Review No results found.   EKG Interpretation None      MDM   Final diagnoses:  Insect bite of abdomen with local reaction, initial encounter     Patient is well appearing.  Non-toxic.  Actively scratching at her abdomen.  excoriations present at the site.  No abscess at this time.  Sx's appears c/w localized reaction, cellulitis considered, but given sudden onset after a bee sting, clinical suspicion is low  Patient is feeling better after atarax, pepcid and prednisone, and ice.  Erythema slightly improved.  Pt advised to return here in 1-2 days if the sx's are not improving.    Hagan Vanauken L. Trisha Mangleriplett, PA-C 01/26/14 [phone]

## 2014-01-25 NOTE — ED Notes (Signed)
Pt states she was in the garden yesterday and felt like something stung her in the lower abd, pt later noticed to area getting larger and red with warmth. Pt took benadryl with no relief.

## 2014-01-26 NOTE — ED Provider Notes (Signed)
Medical screening examination/treatment/procedure(s) were performed by non-physician practitioner and as supervising physician I was immediately available for consultation/collaboration.   EKG Interpretation None        Syrianna Schillaci F Taelynn Mcelhannon, MD 01/26/14 2024 

## 2015-07-23 ENCOUNTER — Encounter (HOSPITAL_COMMUNITY): Payer: Self-pay | Admitting: Emergency Medicine

## 2015-07-23 ENCOUNTER — Emergency Department (HOSPITAL_COMMUNITY)
Admission: EM | Admit: 2015-07-23 | Discharge: 2015-07-23 | Disposition: A | Discharge status: Home / Self Care | Payer: Medicaid Other | Attending: Emergency Medicine | Admitting: Emergency Medicine

## 2015-07-23 DIAGNOSIS — F17210 Nicotine dependence, cigarettes, uncomplicated: Secondary | ICD-10-CM | POA: Insufficient documentation

## 2015-07-23 DIAGNOSIS — J029 Acute pharyngitis, unspecified: Secondary | ICD-10-CM

## 2015-07-23 DIAGNOSIS — I208 Other forms of angina pectoris: Secondary | ICD-10-CM | POA: Insufficient documentation

## 2015-07-23 DIAGNOSIS — J069 Acute upper respiratory infection, unspecified: Secondary | ICD-10-CM | POA: Insufficient documentation

## 2015-07-23 DIAGNOSIS — Z79899 Other long term (current) drug therapy: Secondary | ICD-10-CM | POA: Diagnosis not present

## 2015-07-23 MED ORDER — CETIRIZINE-PSEUDOEPHEDRINE ER 5-120 MG PO TB12: 1.0000 | ORAL_TABLET | Freq: Two times a day (BID) | ORAL | Status: AC

## 2015-07-23 MED ORDER — AMOXICILLIN 500 MG PO CAPS: 500.0000 mg | ORAL_CAPSULE | Freq: Three times a day (TID) | ORAL | Status: AC

## 2015-07-23 MED ORDER — MAGIC MOUTHWASH W/LIDOCAINE: 5.0000 mL | Freq: Three times a day (TID) | ORAL | Status: AC | PRN

## 2015-07-23 NOTE — ED Notes (Signed)
Pt states that she has a bad sore throat and has been running a fever.  Motrin at 0430 this morning.

## 2015-07-23 NOTE — ED Provider Notes (Signed)
CSN: [id number]     Arrival date & time 07/23/15  0816 History   First MD Initiated Contact with Patient 07/23/15 [phone]     No chief complaint on file.    (Consider location/radiation/quality/duration/timing/severity/associated sxs/prior Treatment) HPI   Megan Barron is a 30 y.o. female who presents to the Emergency Department complaining of sore throat and fever for 3 days.  She states her throat hurts with swallowing and she has been unable to eat solid foods.  She has bee drinking fluids.  She also complains of nasal congestion, occasional cough that is non-productive.  Tmax fever of 102 yesterday.  She has been taking OTC cold and cough medications and ibuprofen without significant relief.  She denies vomiting, hematemesis, chest pain or shortness of breath   Past Medical History  Diagnosis Date  . Angina at rest    Past Surgical History  Procedure Laterality Date  . Freon procedure for cervical dysplasia     Family History  Problem Relation Age of Onset  . Diabetes Father   . Cancer Other   . Diabetes Other    Social History  Substance Use Topics  . Smoking status: Current Every Day Smoker -- 1.00 packs/day    Types: Cigarettes  . Smokeless tobacco: Not on file  . Alcohol Use: Yes   OB History    No data available     Review of Systems  Constitutional: Negative for fever, chills, activity change and appetite change.  HENT: Positive for congestion and sore throat. Negative for ear pain, facial swelling, trouble swallowing and voice change.   Eyes: Negative for pain and visual disturbance.  Respiratory: Negative for cough and shortness of breath.   Gastrointestinal: Negative for nausea, vomiting and abdominal pain.  Musculoskeletal: Negative for arthralgias, neck pain and neck stiffness.  Skin: Negative for color change and rash.  Neurological: Negative for dizziness, facial asymmetry, speech difficulty, numbness and headaches.  Hematological: Negative for  adenopathy.  All other systems reviewed and are negative.     Allergies  Review of patient's allergies indicates no known allergies.  Home Medications   Prior to Admission medications   Medication Sig Start Date End Date Taking? Authorizing Provider  calcium carbonate (TUMS - DOSED IN MG ELEMENTAL CALCIUM) 500 MG chewable tablet Chew 2 tablets by mouth every 4 (four) hours as needed for indigestion or heartburn.     Historical Provider, MD  hydrOXYzine (ATARAX/VISTARIL) 25 MG tablet Take 1 tablet (25 mg total) by mouth every 6 (six) hours. 01/25/14   Terrelle Ruffolo, PA-C  ibuprofen (ADVIL,MOTRIN) 800 MG tablet Take 800 mg by mouth every 8 (eight) hours as needed for moderate pain.     Historical Provider, MD  Multiple Vitamin (MULTIVITAMIN WITH MINERALS) TABS tablet Take 1 tablet by mouth daily.    Historical Provider, MD  oxyCODONE (OXY IR/ROXICODONE) 5 MG immediate release tablet Take 5 mg by mouth every 4 (four) hours as needed for severe pain.    Historical Provider, MD  predniSONE (DELTASONE) 20 MG tablet Take 2 tablets (40 mg total) by mouth daily. For 5 days 01/25/14   Alanie Syler, PA-C  sertraline (ZOLOFT) 50 MG tablet Take 50 mg by mouth at bedtime.    Historical Provider, MD   BP 119/93 mmHg  Pulse 106  Temp(Src) 98.1 F (36.7 C) (Oral)  Resp 18  Ht  (1.6 m)  Wt 111.131 kg  BMI 43.41 kg/m2  SpO2 98% Physical Exam  Constitutional: She is oriented  to person, place, and time. She appears well-developed and well-nourished. No distress.  HENT:  Head: Normocephalic and atraumatic.  Right Ear: Tympanic membrane and ear canal normal.  Left Ear: Tympanic membrane and ear canal normal.  Mouth/Throat: Uvula is midline and mucous membranes are normal. No trismus in the jaw. No uvula swelling. Posterior oropharyngeal edema and posterior oropharyngeal erythema present. No oropharyngeal exudate or tonsillar abscesses.  Neck: Normal range of motion. Neck supple.  Cardiovascular:  Normal rate, regular rhythm and normal heart sounds.   Pulmonary/Chest: Effort normal and breath sounds normal. No respiratory distress. She has no wheezes. She has no rales.  Abdominal: Soft. She exhibits no distension. There is no splenomegaly. There is no tenderness.  Musculoskeletal: Normal range of motion.  Lymphadenopathy:    She has no cervical adenopathy.  Neurological: She is alert and oriented to person, place, and time. She exhibits normal muscle tone. Coordination normal.  Skin: Skin is warm and dry.  Psychiatric: She has a normal mood and affect.  Nursing note and vitals reviewed.   ED Course  Procedures (including critical care time) Labs Review Labs Reviewed - No data to display  Imaging Review No results found. I have personally reviewed and evaluated these images and lab results as part of my medical decision-making.   EKG Interpretation None      MDM   Final diagnoses:  Pharyngitis  URI (upper respiratory infection)    Pt non-toxic appearing.  Vitals stable. Pmd for f/u if needed.  rx for magic mouthwash, amxoil and zyrtec-D.  Pt was advised to d/c the zyrtec if she becomes hypertensive.      Pauline Aus, PA-C 07/23/15 1610  Rolland Porter, MD 08/02/15 2250

## 2015-07-23 NOTE — Discharge Instructions (Signed)

## 2015-12-01 ENCOUNTER — Emergency Department (HOSPITAL_COMMUNITY)
Admission: EM | Admit: 2015-12-01 | Discharge: 2015-12-01 | Disposition: A | Discharge status: Home / Self Care | Payer: Medicaid Other | Attending: Emergency Medicine | Admitting: Emergency Medicine

## 2015-12-01 ENCOUNTER — Encounter (HOSPITAL_COMMUNITY): Payer: Self-pay | Admitting: Emergency Medicine

## 2015-12-01 DIAGNOSIS — K0889 Other specified disorders of teeth and supporting structures: Secondary | ICD-10-CM | POA: Diagnosis present

## 2015-12-01 DIAGNOSIS — K029 Dental caries, unspecified: Secondary | ICD-10-CM | POA: Diagnosis not present

## 2015-12-01 DIAGNOSIS — F17210 Nicotine dependence, cigarettes, uncomplicated: Secondary | ICD-10-CM | POA: Insufficient documentation

## 2015-12-01 MED ORDER — CLINDAMYCIN HCL 300 MG PO CAPS: 300.0000 mg | ORAL_CAPSULE | Freq: Four times a day (QID) | ORAL | Status: AC

## 2015-12-01 MED ORDER — DICLOFENAC SODIUM 75 MG PO TBEC: 75.0000 mg | DELAYED_RELEASE_TABLET | Freq: Two times a day (BID) | ORAL | Status: AC

## 2015-12-01 NOTE — ED Provider Notes (Signed)
CSN: [id number]     Arrival date & time 12/01/15  [id number] History   First MD Initiated Contact with Patient 12/01/15 1002     Chief Complaint  Patient presents with  . Dental Pain     (Consider location/radiation/quality/duration/timing/severity/associated sxs/prior Treatment) HPI   Megan Barron is a 30 y.o. female who presents to the Emergency Department complaining of left lower dental pain for one week.  She reports continued decay of the affected tooth that began after eating.  She describes a throbbing pain to her face and jaw that is unrelieved with hydrocodone, ice packs.  She has an appt with her dentist next week.  She denies fever, neck pain, facial swelling, and difficulty swallowing.     Past Medical History  Diagnosis Date  . 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 Freon procedure for cervical dysplasia     Family History  Problem Relation Age of Onset  . Diabetes Father   . Cancer Other   . Diabetes Other    Social History  Substance Use Topics  . Smoking status: Current Every Day Smoker -- 1.00 packs/day    Types: Cigarettes  . Smokeless tobacco: None  . Alcohol Use: Yes   OB History    No data available     Review of Systems  Constitutional: Negative for fever and appetite change.  HENT: Positive for dental problem. Negative for congestion, facial swelling, sore throat and trouble swallowing.   Eyes: Negative for pain and visual disturbance.  Musculoskeletal: Negative for neck pain and neck stiffness.  Neurological: Negative for dizziness, facial asymmetry and headaches.  Hematological: Negative for adenopathy.  All other systems reviewed and are negative.     Allergies  Review of patient's allergies indicates no known allergies.  Home Medications   Prior to Admission medications   Medication Sig Start Date End Date Taking? Authorizing Provider  amoxicillin (AMOXIL) 500 MG capsule Take 1 capsule (500 mg  total) by mouth 3 (three) times daily. For 10 days 07/23/15   Jusiah Aguayo, PA-C  calcium carbonate (TUMS - DOSED IN MG ELEMENTAL CALCIUM) 500 MG chewable tablet Chew 2 tablets by mouth every 4 (four) hours as needed for indigestion or heartburn.     Historical Provider, MD  cetirizine-pseudoephedrine (ZYRTEC-D) 5-120 MG tablet Take 1 tablet by mouth 2 (two) times daily. 07/23/15   Jameson Tormey, PA-C  hydrOXYzine (ATARAX/VISTARIL) 25 MG tablet Take 1 tablet (25 mg total) by mouth every 6 (six) hours. 01/25/14   Darrien Belter, PA-C  ibuprofen (ADVIL,MOTRIN) 800 MG tablet Take 800 mg by mouth every 8 (eight) hours as needed for moderate pain.     Historical Provider, MD  magic mouthwash w/lidocaine SOLN Take 5 mLs by mouth 3 (three) times daily as needed for mouth pain. Swish and spit, do not swallow 07/23/15   Chrissy Ealey, PA-C  Multiple Vitamin (MULTIVITAMIN WITH MINERALS) TABS tablet Take 1 tablet by mouth daily.    Historical Provider, MD  oxyCODONE (OXY IR/ROXICODONE) 5 MG immediate release tablet Take 5 mg by mouth every 4 (four) hours as needed for severe pain.    Historical Provider, MD  predniSONE (DELTASONE) 20 MG tablet Take 2 tablets (40 mg total) by mouth daily. For 5 days 01/25/14   Jahlen Bollman, PA-C  sertraline (ZOLOFT) 50 MG tablet Take 50 mg by mouth at bedtime.    Historical Provider, MD   BP 146/93 mmHg  Pulse 79  Temp(Src) 97.6 F (36.4 C) (Oral)  Resp 18  Ht 5\' 3"  (1.6 m)  Wt 113.399 kg  BMI 44.30 kg/m2  SpO2 100%  LMP 11/24/2015 Physical Exam  Constitutional: She is oriented to person, place, and time. She appears well-developed and well-nourished. No distress.  HENT:  Head: Normocephalic and atraumatic.  Right Ear: Tympanic membrane and ear canal normal.  Left Ear: Tympanic membrane and ear canal normal.  Mouth/Throat: Uvula is midline, oropharynx is clear and moist and mucous membranes are normal. No trismus in the jaw. Dental caries present. No dental abscesses  or uvula swelling.  Tenderness and dental caries of the left lower third molar.  No facial swelling, obvious dental abscess, trismus, or sublingual abnml.    Neck: Normal range of motion. Neck supple.  Cardiovascular: Normal rate, regular rhythm and normal heart sounds.   No murmur heard. Pulmonary/Chest: Effort normal and breath sounds normal.  Musculoskeletal: Normal range of motion.  Lymphadenopathy:    She has no cervical adenopathy.  Neurological: She is alert and oriented to person, place, and time. She exhibits normal muscle tone. Coordination normal.  Skin: Skin is warm and dry.  Nursing note and vitals reviewed.   ED Course  Procedures (including critical care time) Labs Review Labs Reviewed - No data to display  Imaging Review No results found. I have personally reviewed and evaluated these images and lab results as part of my medical decision-making.   EKG Interpretation None      MDM   Final diagnoses:  Pain, dental    Pt well appearing, vitals stable.  No concerning sx's for retropharyngeal abscess or Ludwig's angina.  Has appt with dentist next week.  Appears stable for d/c  New Prescriptions   CLINDAMYCIN (CLEOCIN) 300 MG CAPSULE    Take 1 capsule (300 mg total) by mouth 4 (four) times daily. For 7 days   DICLOFENAC (VOLTAREN) 75 MG EC TABLET    Take 1 tablet (75 mg total) by mouth 2 (two) times daily. Take with food      Pauline Ausammy Torez Beauregard, PA-C 12/01/15 1037  Lavera Guiseana Duo Liu, MD 12/01/15 [phone]

## 2015-12-01 NOTE — ED Notes (Addendum)
Pt c/o LT lower dental pain. States she chipped her wisdom tooth on Father's Day weekend. Pt reports taking her dad's pain medication with no relief. Pt has dental appt on Monday.

## 2016-07-25 ENCOUNTER — Other Ambulatory Visit (HOSPITAL_COMMUNITY): Payer: Self-pay | Admitting: Internal Medicine

## 2016-07-25 ENCOUNTER — Ambulatory Visit (HOSPITAL_COMMUNITY)
Admission: RE | Admit: 2016-07-25 | Discharge: 2016-07-25 | Disposition: A | Discharge status: Home / Self Care | Payer: Medicaid Other | Source: Ambulatory Visit | Attending: Internal Medicine | Admitting: Internal Medicine

## 2016-07-25 DIAGNOSIS — M5440 Lumbago with sciatica, unspecified side: Secondary | ICD-10-CM

## 2017-01-10 ENCOUNTER — Encounter: Payer: Self-pay | Admitting: Orthopaedic Surgery

## 2017-03-11 ENCOUNTER — Telehealth: Payer: Self-pay | Admitting: Orthopedic Surgery

## 2017-03-11 NOTE — Telephone Encounter (Signed)
We had been unable to contact this patient by phone or by letter.  We have sent a fax to pt's PCP, [facility] that we have now closed the referral at this time

## 2018-09-08 IMAGING — DX DG LUMBAR SPINE COMPLETE 4+V
5 series · 5 of 5 positions shown · non-contrast
Comparison: Coronal and sagittal CT images through the lumbar spine
from a scan dated November 12, 2012

CLINICAL DATA: Low back pain and right-sided pain. History of disc
disease in the low back. Numbness in the legs develops after
prolonged standing.

EXAM:
LUMBAR SPINE - COMPLETE 4+ VIEW

[l-spine ap]
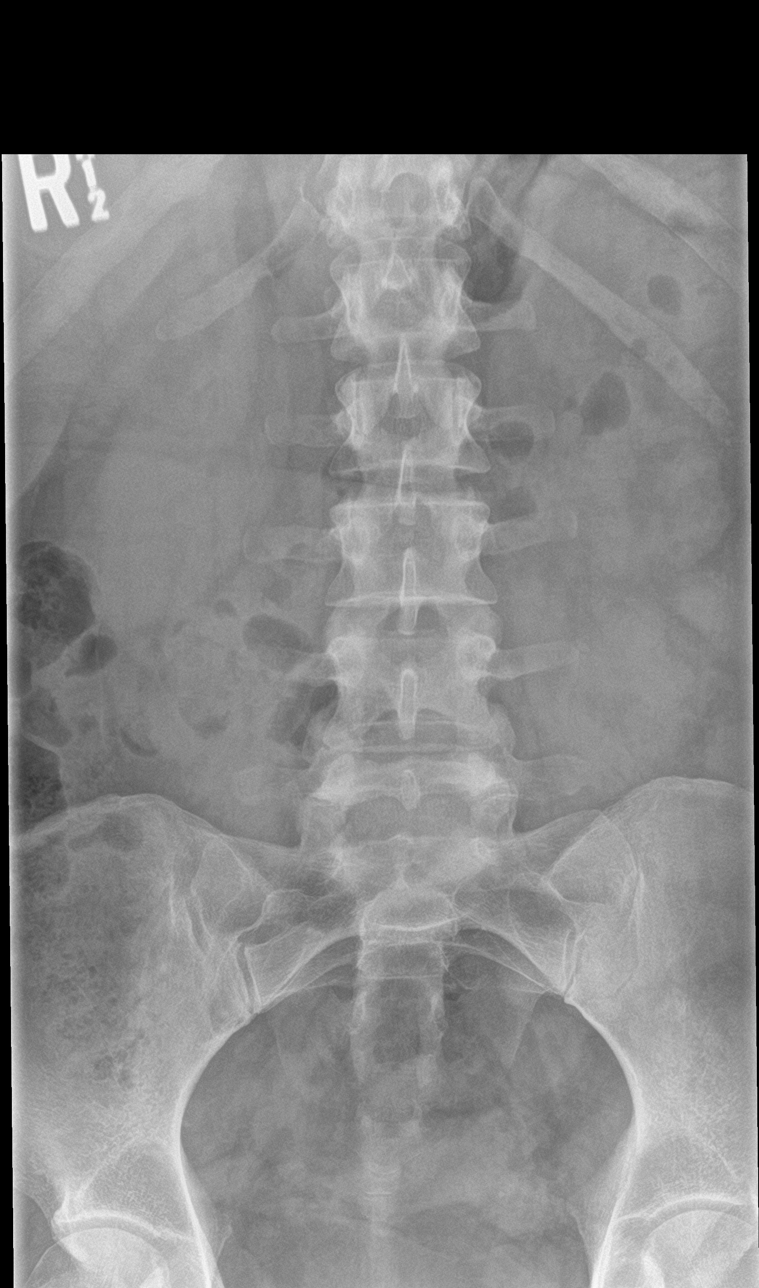

[l-spine obl (1 of 2)]
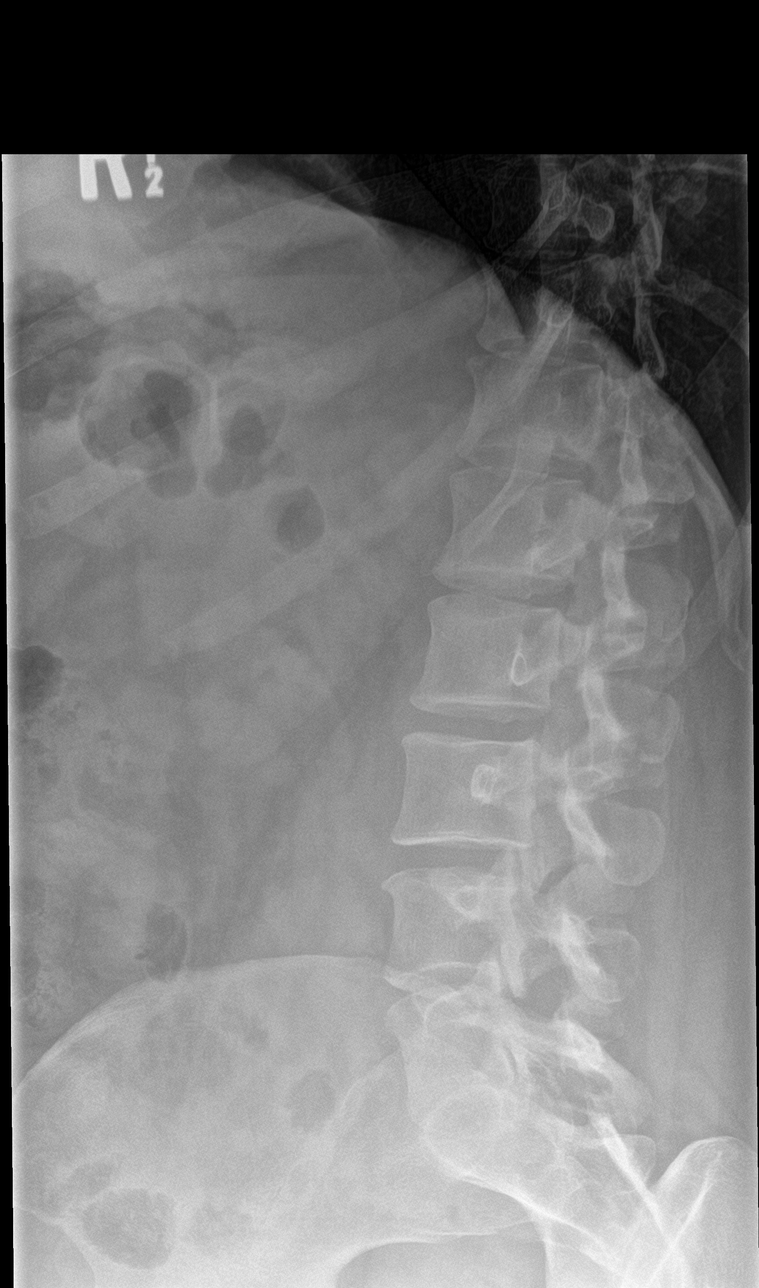

[l-spine obl (2 of 2)]
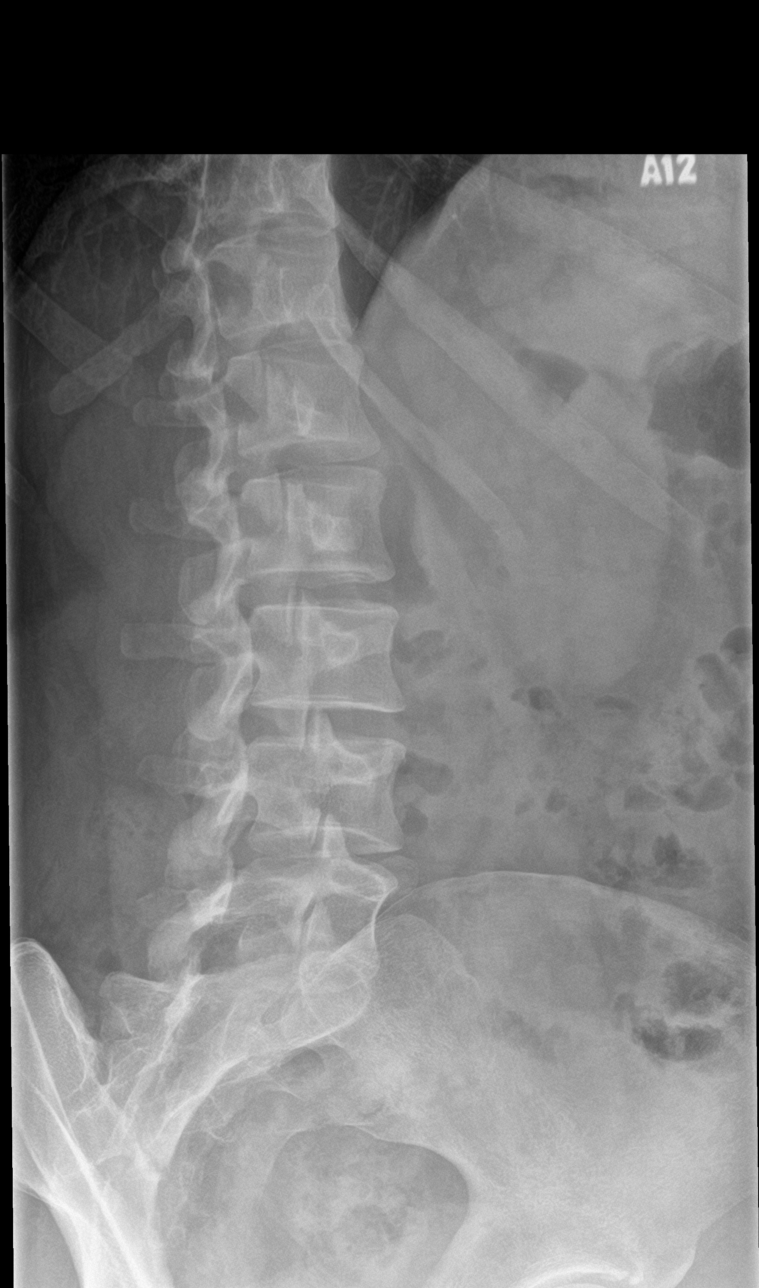

[l-spine lat]
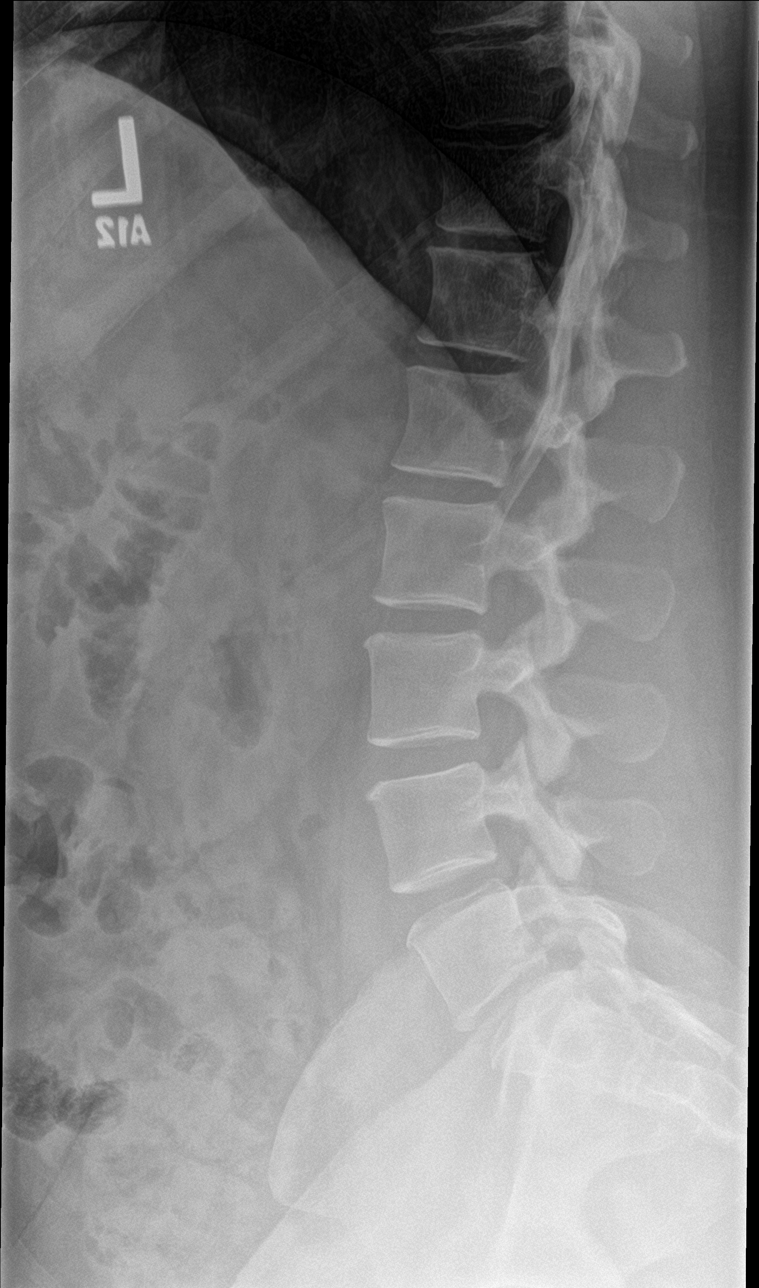

[l-spine spot]
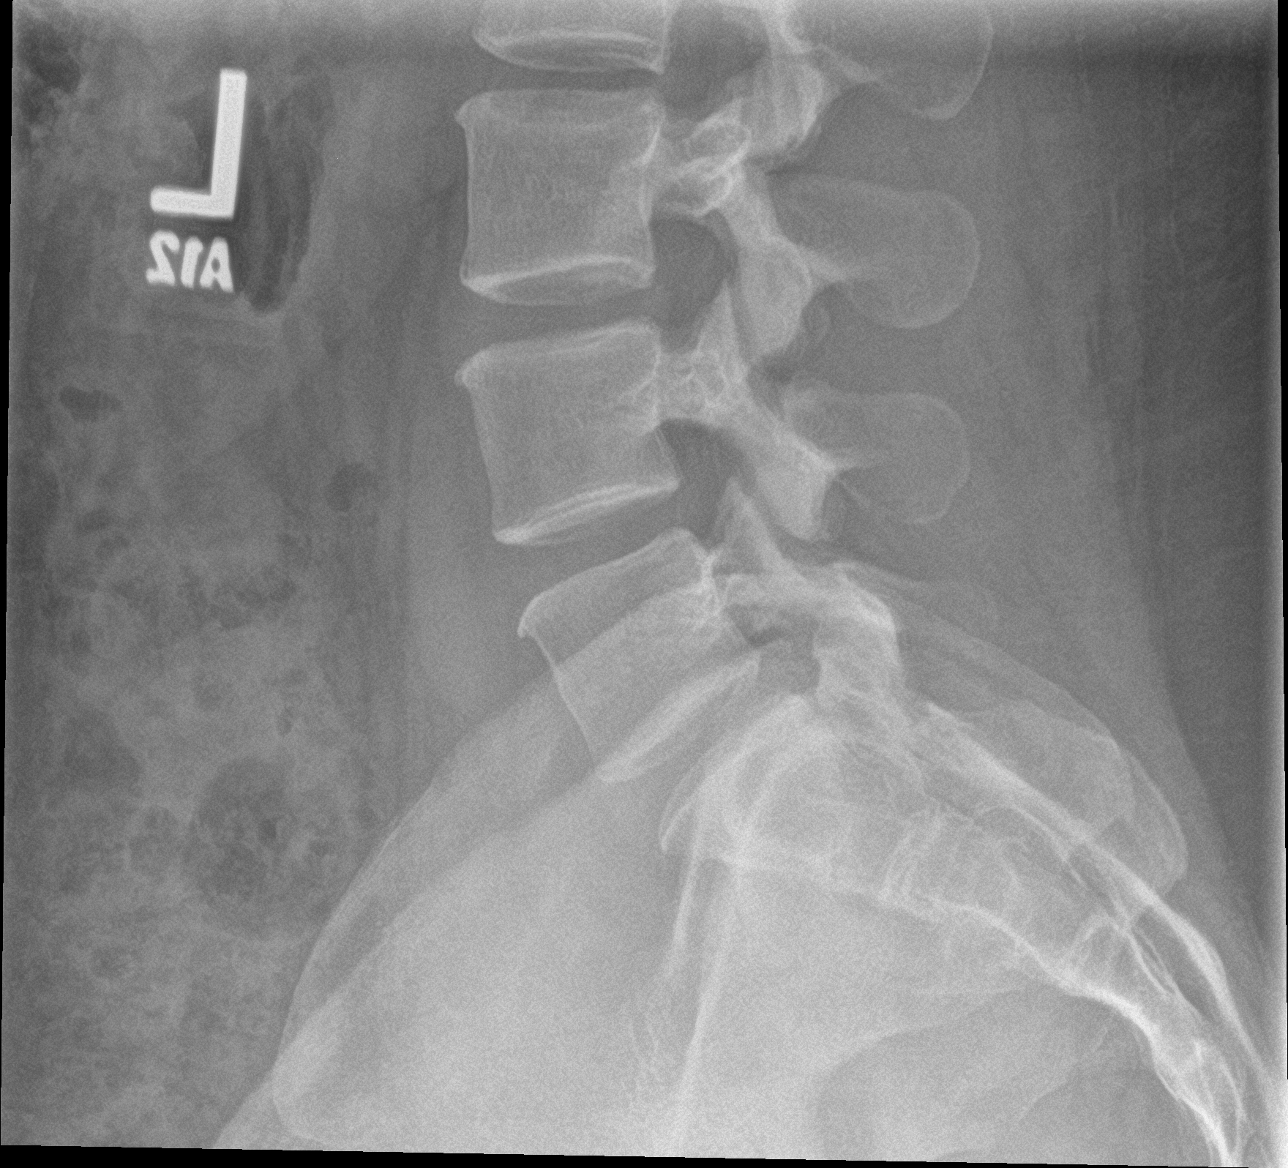

[5 of 5 positions shown; findings below may reference images not displayed]

FINDINGS: The lumbar vertebral bodies are preserved in height. The pedicles
and transverse processes are intact. The disc space heights are
reasonably well-maintained. There is no spondylolisthesis. There is
no significant facet joint hypertrophy. The observed portions of the
sacrum are normal.
IMPRESSION: There is no acute or significant chronic bony abnormality of the
lumbar spine. If the patient has true radicular symptoms, lumbar
spine MRI may be a useful next imaging step.
